# Patient Record
Sex: Female | Born: 1995 | Race: White | Hispanic: No | Marital: Single | Smoking: Current every day smoker
Health system: Southern US, Community
[De-identification: ages and names within clinical notes are randomized; demographics above are authoritative.]

## PROBLEM LIST (undated history)

## (undated) DIAGNOSIS — F419 Anxiety disorder, unspecified: Secondary | ICD-10-CM

## (undated) DIAGNOSIS — N12 Tubulo-interstitial nephritis, not specified as acute or chronic: Secondary | ICD-10-CM

## (undated) DIAGNOSIS — F329 Major depressive disorder, single episode, unspecified: Secondary | ICD-10-CM

## (undated) DIAGNOSIS — N943 Premenstrual tension syndrome: Secondary | ICD-10-CM

## (undated) HISTORY — DX: Major depressive disorder, single episode, unspecified: F32.9

## (undated) HISTORY — DX: Anxiety disorder, unspecified: F41.9

## (undated) HISTORY — PX: TONSILLECTOMY: SUR1361

## (undated) HISTORY — PX: KIDNEY SURGERY: SHX687

## (undated) HISTORY — DX: Tubulo-interstitial nephritis, not specified as acute or chronic: N12

## (undated) HISTORY — DX: Premenstrual tension syndrome: N94.3

---
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
## 2017-01-28 ENCOUNTER — Ambulatory Visit: Payer: 59 | Admitting: Adult Health

## 2017-01-30 ENCOUNTER — Telehealth: Payer: Self-pay | Admitting: Adult Health

## 2017-01-30 ENCOUNTER — Ambulatory Visit (INDEPENDENT_AMBULATORY_CARE_PROVIDER_SITE_OTHER): Payer: 59 | Admitting: Adult Health

## 2017-01-30 VITALS — BP 122/80 | Temp 97.9°F | Ht 63.54 in | Wt 139.0 lb

## 2017-01-30 DIAGNOSIS — F329 Major depressive disorder, single episode, unspecified: Secondary | ICD-10-CM

## 2017-01-30 DIAGNOSIS — F419 Anxiety disorder, unspecified: Principal | ICD-10-CM

## 2017-01-30 DIAGNOSIS — G43011 Migraine without aura, intractable, with status migrainosus: Secondary | ICD-10-CM | POA: Diagnosis not present

## 2017-01-30 DIAGNOSIS — F418 Other specified anxiety disorders: Secondary | ICD-10-CM | POA: Diagnosis not present

## 2017-01-30 MED ORDER — CITALOPRAM HYDROBROMIDE 20 MG PO TABS: 20.0000 mg | ORAL_TABLET | Freq: Every day | ORAL | 3 refills | Status: AC

## 2017-01-30 MED ORDER — BUTALBITAL-APAP-CAFFEINE 50-325-40 MG PO TABS: 1.0000 | ORAL_TABLET | Freq: Four times a day (QID) | ORAL | 0 refills | Status: AC | PRN

## 2017-01-30 NOTE — Telephone Encounter (Signed)
See below

## 2017-01-30 NOTE — Telephone Encounter (Signed)
pts grandfather Talbert ForestRichard Cowlings [phone] would like to give you a brief background of the pt.  Pt is under extreme amount of stress due to family issues and personal issues.  Kandee KeenCory can call the grandfather if he needs more information.

## 2017-01-30 NOTE — Patient Instructions (Signed)
I have sent in a prescription for Celexa 20 mg, take this daily for anxiety and depression. Please follow up with me in one month

## 2017-01-30 NOTE — Progress Notes (Signed)
Subjective:    Patient ID: Autumn Wise, female    DOB: 06/18/1996, 21 y.o.   MRN: [id number]  HPI  21 year old female who  has a past medical history of Anxiety and depression; PMS (premenstrual syndrome); and Pyelonephritis. She reports to the office today for worsening migraine headaches over the last week to 10 days. She believes that these headaches are stemming from family issues at home. She is having a difficult time with her mom and her mothers mental illness. She reports that her mother charged her with stealing her phone ( which patient reports never happened), but now she has to go to court and try and get it dropped. This is causing her a lot of anxiety and depression. She denies any thoughts of suicide or self harm but feels as though she has days more often than not that she does not want to get out of bed in the morning.   She has taken Fiorocet in the past for migraines and felt as though this worked well.   Review of Systems See HPI  Past Medical History:  Diagnosis Date  . Anxiety and depression   . PMS (premenstrual syndrome)   . Pyelonephritis     Social History   Social History  . Marital status: Single    Spouse name: N/A  . Number of children: 0  . Years of education: 12   Occupational History  . Not on file.   Social History Main Topics  . Smoking status: Current Every Day Smoker    Packs/day: 1.00    Types: Cigarettes  . Smokeless tobacco: Not on file  . Alcohol use No  . Drug use: Yes    Types: Marijuana  . Sexual activity: Yes    Partners: Male   Other Topics Concern  . Not on file   Social History Narrative   Graduated high school    Lives with boyfriend    No kids   She likes to ride fourwheelers   Right-handed   Drinks coffee, tea and soda on a daily basis    Past Surgical History:  Procedure Laterality Date  . KIDNEY SURGERY    . TONSILLECTOMY      Family History  Problem Relation Age of Onset  . Seizures Mother    Childhood    Allergies  Allergen Reactions  . Vancomycin Other (See Comments)    Current Outpatient Prescriptions on File Prior to Visit  Medication Sig Dispense Refill  . ibuprofen (ADVIL,MOTRIN) 200 MG tablet Take 200 mg by mouth every 8 (eight) hours as needed.     No current facility-administered medications on file prior to visit.     BP 122/80   Temp 97.9 F (36.6 C) (Oral)   Ht 5' 3.54" (1.614 m)   Wt 139 lb (63 kg)   BMI 24.21 kg/m        Objective:   Physical Exam  Constitutional: She is oriented to person, place, and time. She appears well-developed and well-nourished. No distress.  Eyes: Conjunctivae and EOM are normal. Pupils are equal, round, and reactive to light. Right eye exhibits no discharge. Left eye exhibits no discharge. No scleral icterus.  Neck: Normal range of motion. Neck supple. No thyromegaly present.  Cardiovascular: Normal rate, regular rhythm, normal heart sounds and intact distal pulses.  Exam reveals no gallop and no friction rub.   No murmur heard. Pulmonary/Chest: Effort normal and breath sounds normal. No respiratory distress. She has no wheezes.  She has no rales. She exhibits no tenderness.  Musculoskeletal: Normal range of motion. She exhibits no edema, tenderness or deformity.  Lymphadenopathy:    She has no cervical adenopathy.  Neurological: She is alert and oriented to person, place, and time.  Skin: Skin is warm and dry. No rash noted. She is not diaphoretic. No erythema. No pallor.  Psychiatric: She has a normal mood and affect. Her behavior is normal. Judgment and thought content normal.  Nursing note and vitals reviewed.     Assessment & Plan:   1. Anxiety and depression - citalopram (CELEXA) 20 MG tablet; Take 1 tablet (20 mg total) by mouth daily.  Dispense: 30 tablet; Refill: 3 - Follow up in one month - If she develops any thoughts of SI she is to stop medication and go to the ER  2. Intractable migraine without aura  and with status migrainosus - I am hopeful that once her anxiety and depression are better controlled that her migraines will resolve - butalbital-acetaminophen-caffeine (FIORICET, ESGIC) 50-325-40 MG tablet; Take 1 tablet by mouth every 6 (six) hours as needed for headache.  Dispense: 14 tablet; Refill: 0 - Will follow up with me in one month   Shirline Frees, NP

## 2017-03-04 ENCOUNTER — Ambulatory Visit: Payer: 59 | Admitting: Adult Health

## 2017-05-26 ENCOUNTER — Encounter: Payer: 59 | Admitting: Advanced Practice Midwife

## 2017-05-26 DIAGNOSIS — Z113 Encounter for screening for infections with a predominantly sexual mode of transmission: Secondary | ICD-10-CM
